# Patient Record
Sex: Male | Born: 1985 | Race: Black or African American | Hispanic: No | Marital: Single | Smoking: Never smoker
Health system: Southern US, Community
[De-identification: ages and names within clinical notes are randomized; demographics above are authoritative.]

---

## 2018-01-04 DIAGNOSIS — Y939 Activity, unspecified: Secondary | ICD-10-CM | POA: Insufficient documentation

## 2018-01-04 DIAGNOSIS — Y998 Other external cause status: Secondary | ICD-10-CM | POA: Diagnosis not present

## 2018-01-04 DIAGNOSIS — S60221A Contusion of right hand, initial encounter: Secondary | ICD-10-CM | POA: Insufficient documentation

## 2018-01-04 DIAGNOSIS — Z23 Encounter for immunization: Secondary | ICD-10-CM | POA: Diagnosis not present

## 2018-01-04 DIAGNOSIS — S65311A Laceration of deep palmar arch of right hand, initial encounter: Secondary | ICD-10-CM | POA: Insufficient documentation

## 2018-01-04 DIAGNOSIS — Y9289 Other specified places as the place of occurrence of the external cause: Secondary | ICD-10-CM | POA: Diagnosis not present

## 2018-01-04 DIAGNOSIS — W2209XA Striking against other stationary object, initial encounter: Secondary | ICD-10-CM | POA: Insufficient documentation

## 2018-01-04 DIAGNOSIS — S6991XA Unspecified injury of right wrist, hand and finger(s), initial encounter: Secondary | ICD-10-CM | POA: Diagnosis present

## 2018-01-05 ENCOUNTER — Emergency Department (HOSPITAL_COMMUNITY)
Admission: EM | Admit: 2018-01-05 | Discharge: 2018-01-05 | Disposition: A | Discharge status: Home / Self Care | Attending: Emergency Medicine | Admitting: Emergency Medicine

## 2018-01-05 ENCOUNTER — Encounter (HOSPITAL_COMMUNITY): Payer: Self-pay | Admitting: Emergency Medicine

## 2018-01-05 ENCOUNTER — Emergency Department (HOSPITAL_COMMUNITY)

## 2018-01-05 ENCOUNTER — Other Ambulatory Visit: Payer: Self-pay

## 2018-01-05 DIAGNOSIS — S65311A Laceration of deep palmar arch of right hand, initial encounter: Secondary | ICD-10-CM

## 2018-01-05 DIAGNOSIS — S60221A Contusion of right hand, initial encounter: Secondary | ICD-10-CM

## 2018-01-05 MED ORDER — IBUPROFEN 400 MG PO TABS
600.0000 mg | ORAL_TABLET | Freq: Once | ORAL | Status: AC
  Administered 2018-01-05: 600 mg via ORAL
  Filled 2018-01-05: qty 2

## 2018-01-05 MED ORDER — TETANUS-DIPHTH-ACELL PERTUSSIS 5-2.5-18.5 LF-MCG/0.5 IM SUSP
0.5000 mL | Freq: Once | INTRAMUSCULAR | Status: AC
  Administered 2018-01-05: 0.5 mL via INTRAMUSCULAR
  Filled 2018-01-05: qty 0.5

## 2018-01-05 MED ORDER — ACETAMINOPHEN 500 MG PO TABS
1000.0000 mg | ORAL_TABLET | Freq: Once | ORAL | Status: AC
  Administered 2018-01-05: 1000 mg via ORAL
  Filled 2018-01-05: qty 2

## 2018-01-05 NOTE — Discharge Instructions (Addendum)
Keep the wound clean and dry.  If you are allowed to have ice packs ice will help with the discomfort.  You  can have acetaminophen 1000 mg plus ibuprofen 600 mg every 6 hours as needed for pain.  Keep the Steri-Strips in place for at least 7 to 10 days and then they can fall off.  Recheck if the wound gets infected.  Your tetanus booster was updated tonight.

## 2018-01-05 NOTE — ED Notes (Signed)
Wound cleaned Steristrips applied to wound

## 2018-01-05 NOTE — ED Triage Notes (Signed)
Pt c/o right hand pain with laceration from punching wall.

## 2018-01-05 NOTE — ED Provider Notes (Signed)
[facility] Provider [facility]   CSN: [id number] Arrival date & time: 01/04/18  2329  Time seen 02:55 AM   History   Chief Complaint Chief Complaint  Patient presents with  . Hand Pain    HPI Derrick Mason is a 32 y.o. male.  HPI patient reports he is incarcerated he got frustrated this evening.  He was in a cell by himself and he punched a metal door with his right hand, patient is right-handed.  This happened around 8:30 PM tonight.  He complains of a lot of pain around the MCP joint of his index finger with a superficial laceration.  He denies any other injury.  He does not know when his last tetanus shot was.  History reviewed. No pertinent past medical history.  There are no active problems to display for this patient.   History reviewed. No pertinent surgical history.      Home Medications    Prior to Admission medications   Not on File    Family History History reviewed. No pertinent family history.  Social History Social History   Tobacco Use  . Smoking status: Never Smoker  . Smokeless tobacco: Never Used  Substance Use Topics  . Alcohol use: Not on file  . Drug use: Not on file     Allergies   Patient has no allergy information on record.   Review of Systems Review of Systems  All other systems reviewed and are negative.    Physical Exam Updated Vital Signs BP 129/72 (BP Location: Right Arm)   Pulse (!) 58   Temp 98 F (36.7 C) (Oral)   Resp 16   Ht 6\' 1"  (1.854 m)   SpO2 100%   Vital signs normal except for bradycardia   Physical Exam  Constitutional: He is oriented to person, place, and time. He appears well-developed and well-nourished.  HENT:  Head: Normocephalic and atraumatic.  Right Ear: External ear normal.  Left Ear: External ear normal.  Nose: Nose normal.  Eyes: Conjunctivae and EOM are normal.  Neck: Normal range of motion.  Cardiovascular: Bradycardia present.  Pulmonary/Chest: Effort normal.  No respiratory distress.  Musculoskeletal:  Patient is noted to have some swelling around the MCP joint of his right index finger he has pain on range of motion of that finger also.  There is a very superficial 1 cm laceration that was well approximated, I had to pull it apart to see how deep the laceration was and it is superficial.  He does not have pain to palpation to the rest of his hand or in his wrist.  He has good range of motion of the wrist.  Neurological: He is alert and oriented to person, place, and time. No cranial nerve deficit.  Skin: Skin is warm and dry.  Psychiatric: He has a normal mood and affect. His behavior is normal. Thought content normal.  Nursing note and vitals reviewed.      ED Treatments / Results  Labs (all labs ordered are listed, but only abnormal results are displayed) Labs Reviewed - No data to display  EKG None  Radiology Dg Hand Complete Right  Result Date: 01/05/2018 CLINICAL DATA:  Punched ischial wall with hand pain, initial encounter EXAM: RIGHT HAND - COMPLETE 3+ VIEW COMPARISON:  None. FINDINGS: There is no evidence of fracture or dislocation. There is no evidence of arthropathy or other focal bone abnormality. Soft tissues are unremarkable. IMPRESSION: No acute abnormality. Electronically Signed   By: Loraine LericheMark  Lukens M.D.   On: 01/05/2018 00:39    Procedures Procedures (including critical care time)  Medications Ordered in ED Medications  ibuprofen (ADVIL,MOTRIN) tablet 600 mg (600 mg Oral Given 01/05/18 0313)  acetaminophen (TYLENOL) tablet 1,000 mg (1,000 mg Oral Given 01/05/18 0313)  Tdap (BOOSTRIX) injection 0.5 mL (0.5 mLs Intramuscular Given 01/05/18 0324)     Initial Impression / Assessment and Plan / ED Course  I have reviewed the triage vital signs and the nursing notes.  Pertinent labs & imaging results that were available during my care of the patient were reviewed by me and considered in my medical decision making (see chart  for details).     Patient was given ibuprofen and Tylenol for pain.  His tetanus status was updated.  Patient was reluctant to get sutures because he knew the numbing medicine would hurt.  The wound was well approximated and even with range of motion it did not gap open.  It was Steri-Stripped by nursing staff.  Final Clinical Impressions(s) / ED Diagnoses   Final diagnoses:  Contusion of right hand, initial encounter  Laceration of deep palmar arch of right hand, initial encounter    ED Discharge Orders    None    OTC ibuprofen and acetaminophen  Plan discharge  Devoria AlbeIva Emil Weigold, MD, Con[facility] PyoFACEP    Dalissa Lovin, MD 01/05/18 [phone]

## 2020-02-13 IMAGING — DX DG HAND COMPLETE 3+V*R*
3 series · 3 of 3 positions shown · non-contrast
Comparison: None.

CLINICAL DATA: Punched ischial wall with hand pain, initial
encounter

EXAM:
RIGHT HAND - COMPLETE 3+ VIEW

[hand pa]
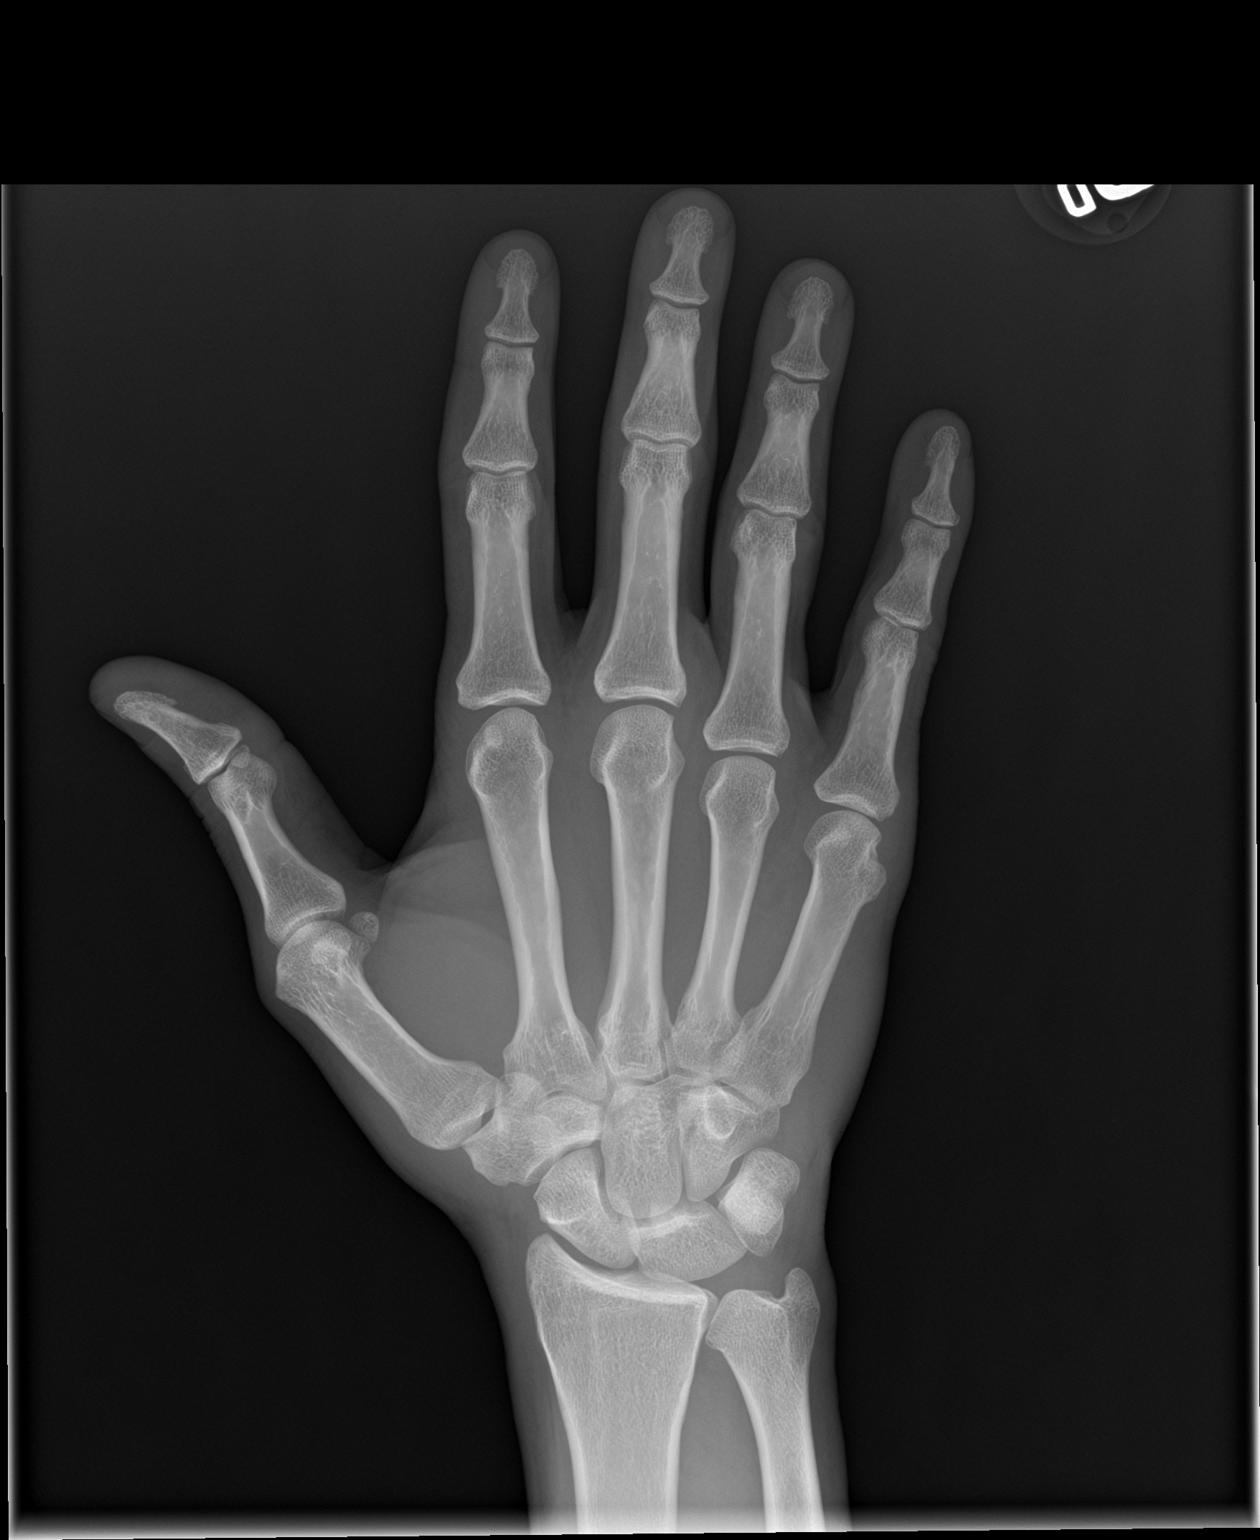

[hand obl]
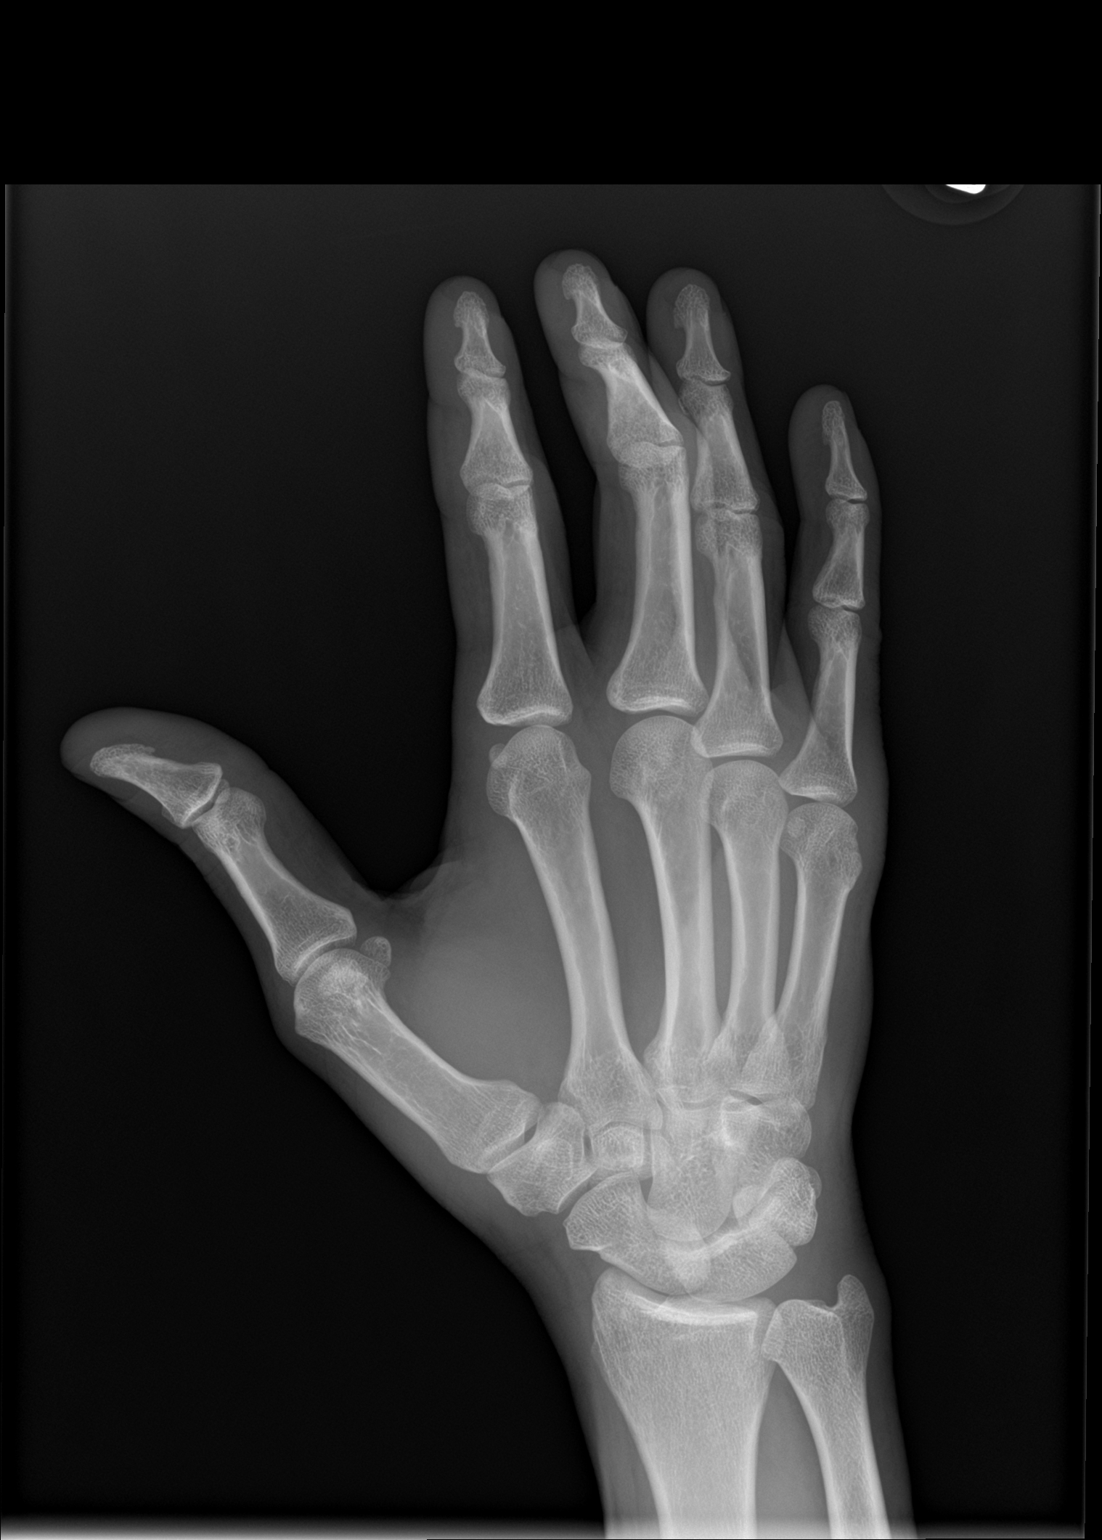

[hand lat]
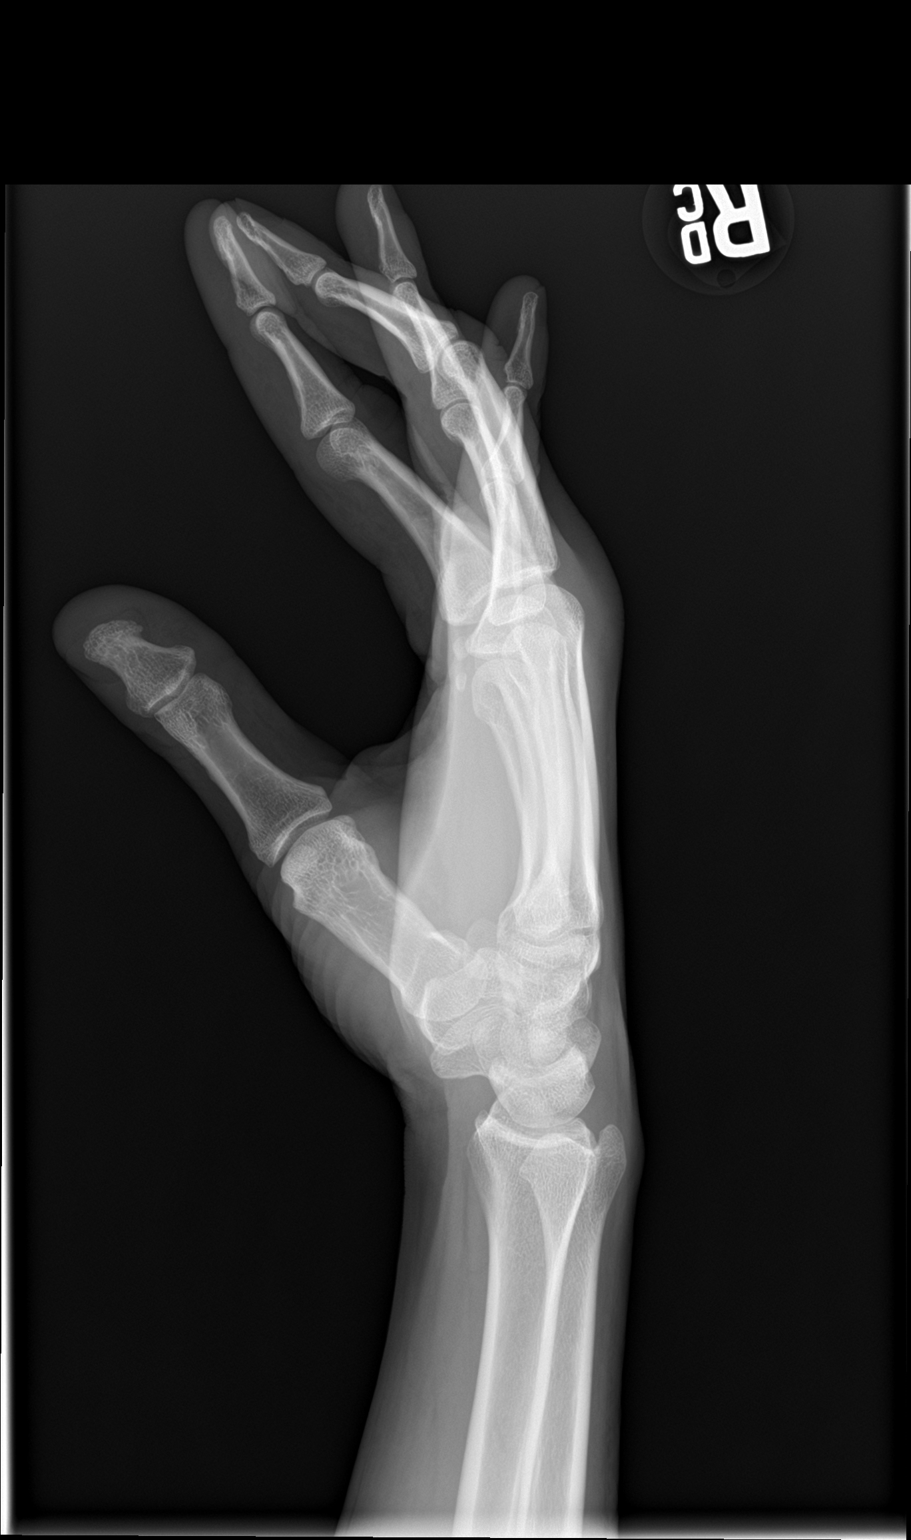

[3 of 3 positions shown; findings below may reference images not displayed]

FINDINGS: There is no evidence of fracture or dislocation. There is no
evidence of arthropathy or other focal bone abnormality. Soft
tissues are unremarkable.
IMPRESSION: No acute abnormality.

## 2024-06-24 DEATH — deceased
